# Patient Record
Sex: Male | Born: 1980 | Hispanic: Yes
Health system: Southern US, Community
[De-identification: ages and names within clinical notes are randomized; demographics above are authoritative.]

---

## 2020-05-08 ENCOUNTER — Emergency Department (HOSPITAL_COMMUNITY): Payer: Self-pay

## 2020-05-08 ENCOUNTER — Other Ambulatory Visit: Payer: Self-pay

## 2020-05-08 ENCOUNTER — Emergency Department (HOSPITAL_COMMUNITY)
Admission: EM | Admit: 2020-05-08 | Discharge: 2020-05-08 | Disposition: A | Discharge status: Home / Self Care | Payer: Self-pay | Attending: Emergency Medicine | Admitting: Emergency Medicine

## 2020-05-08 DIAGNOSIS — R569 Unspecified convulsions: Secondary | ICD-10-CM

## 2020-05-08 DIAGNOSIS — Y929 Unspecified place or not applicable: Secondary | ICD-10-CM | POA: Insufficient documentation

## 2020-05-08 DIAGNOSIS — Y939 Activity, unspecified: Secondary | ICD-10-CM | POA: Insufficient documentation

## 2020-05-08 DIAGNOSIS — R4182 Altered mental status, unspecified: Secondary | ICD-10-CM | POA: Insufficient documentation

## 2020-05-08 DIAGNOSIS — W19XXXA Unspecified fall, initial encounter: Secondary | ICD-10-CM | POA: Insufficient documentation

## 2020-05-08 DIAGNOSIS — R55 Syncope and collapse: Secondary | ICD-10-CM | POA: Insufficient documentation

## 2020-05-08 DIAGNOSIS — S0511XA Contusion of eyeball and orbital tissues, right eye, initial encounter: Secondary | ICD-10-CM | POA: Insufficient documentation

## 2020-05-08 DIAGNOSIS — Y999 Unspecified external cause status: Secondary | ICD-10-CM | POA: Insufficient documentation

## 2020-05-08 LAB — I-STAT VENOUS BLOOD GAS, ED
Acid-base deficit: 1 mmol/L (ref 0.0–2.0)
Bicarbonate: 24.2 mmol/L (ref 20.0–28.0)
Calcium, Ion: 1.11 mmol/L — ABNORMAL LOW (ref 1.15–1.40)
HCT: 40 % (ref 39.0–52.0)
Hemoglobin: 13.6 g/dL (ref 13.0–17.0)
O2 Saturation: 86 %
Potassium: 3.9 mmol/L (ref 3.5–5.1)
Sodium: 140 mmol/L (ref 135–145)
TCO2: 25 mmol/L (ref 22–32)
pCO2, Ven: 39.7 mmHg — ABNORMAL LOW (ref 44.0–60.0)
pH, Ven: 7.393 (ref 7.250–7.430)
pO2, Ven: 52 mmHg — ABNORMAL HIGH (ref 32.0–45.0)

## 2020-05-08 LAB — CBC WITH DIFFERENTIAL/PLATELET
Abs Immature Granulocytes: 0.13 10*3/uL — ABNORMAL HIGH (ref 0.00–0.07)
Basophils Absolute: 0.1 10*3/uL (ref 0.0–0.1)
Basophils Relative: 1 %
Eosinophils Absolute: 0.2 10*3/uL (ref 0.0–0.5)
Eosinophils Relative: 2 %
HCT: 48.4 % (ref 39.0–52.0)
Hemoglobin: 15.3 g/dL (ref 13.0–17.0)
Immature Granulocytes: 1 %
Lymphocytes Relative: 40 %
Lymphs Abs: 4.6 10*3/uL — ABNORMAL HIGH (ref 0.7–4.0)
MCH: 29.1 pg (ref 26.0–34.0)
MCHC: 31.6 g/dL (ref 30.0–36.0)
MCV: 92.2 fL (ref 80.0–100.0)
Monocytes Absolute: 0.8 10*3/uL (ref 0.1–1.0)
Monocytes Relative: 7 %
Neutro Abs: 5.8 10*3/uL (ref 1.7–7.7)
Neutrophils Relative %: 49 %
Platelets: 290 10*3/uL (ref 150–400)
RBC: 5.25 MIL/uL (ref 4.22–5.81)
RDW: 12 % (ref 11.5–15.5)
WBC: 11.7 10*3/uL — ABNORMAL HIGH (ref 4.0–10.5)
nRBC: 0 % (ref 0.0–0.2)

## 2020-05-08 LAB — BASIC METABOLIC PANEL
Anion gap: 9 (ref 5–15)
BUN: 8 mg/dL (ref 6–20)
CO2: 23 mmol/L (ref 22–32)
Calcium: 8.7 mg/dL — ABNORMAL LOW (ref 8.9–10.3)
Chloride: 107 mmol/L (ref 98–111)
Creatinine, Ser: 1.06 mg/dL (ref 0.61–1.24)
GFR calc Af Amer: >60 mL/min (ref >60)
GFR calc non Af Amer: >60 mL/min (ref >60)
Glucose, Bld: 104 mg/dL — ABNORMAL HIGH (ref 70–99)
Potassium: 3.9 mmol/L (ref 3.5–5.1)
Sodium: 139 mmol/L (ref 135–145)

## 2020-05-08 LAB — COMPREHENSIVE METABOLIC PANEL
ALT: 28 U/L (ref 0–44)
AST: 42 U/L — ABNORMAL HIGH (ref 15–41)
Albumin: 4.3 g/dL (ref 3.5–5.0)
Alkaline Phosphatase: 72 U/L (ref 38–126)
Anion gap: 24 — ABNORMAL HIGH (ref 5–15)
BUN: 10 mg/dL (ref 6–20)
CO2: 10 mmol/L — ABNORMAL LOW (ref 22–32)
Calcium: 9 mg/dL (ref 8.9–10.3)
Chloride: 108 mmol/L (ref 98–111)
Creatinine, Ser: 1.21 mg/dL (ref 0.61–1.24)
GFR calc Af Amer: >60 mL/min (ref >60)
GFR calc non Af Amer: >60 mL/min (ref >60)
Glucose, Bld: 140 mg/dL — ABNORMAL HIGH (ref 70–99)
Potassium: 3.7 mmol/L (ref 3.5–5.1)
Sodium: 142 mmol/L (ref 135–145)
Total Bilirubin: 0.8 mg/dL (ref 0.3–1.2)
Total Protein: 8 g/dL (ref 6.5–8.1)

## 2020-05-08 LAB — D-DIMER, QUANTITATIVE: D-Dimer, Quant: 0.32 ug/mL-FEU (ref 0.00–0.50)

## 2020-05-08 LAB — ETHANOL: Alcohol, Ethyl (B): <10 mg/dL (ref <10)

## 2020-05-08 LAB — CBG MONITORING, ED: Glucose-Capillary: 138 mg/dL — ABNORMAL HIGH (ref 70–99)

## 2020-05-08 LAB — MAGNESIUM: Magnesium: 2.1 mg/dL (ref 1.7–2.4)

## 2020-05-08 LAB — TROPONIN I (HIGH SENSITIVITY): Troponin I (High Sensitivity): 5 ng/L (ref <18)

## 2020-05-08 MED ORDER — LORAZEPAM 2 MG/ML IJ SOLN
INTRAMUSCULAR | Status: AC
  Administered 2020-05-08: 2 mg
  Filled 2020-05-08: qty 1

## 2020-05-08 MED ORDER — SODIUM CHLORIDE 0.9 % IV BOLUS
1000.0000 mL | Freq: Once | INTRAVENOUS | Status: AC
  Administered 2020-05-08: 1000 mL via INTRAVENOUS

## 2020-05-08 MED ORDER — LEVETIRACETAM 500 MG PO TABS: 500.0000 mg | ORAL_TABLET | Freq: Two times a day (BID) | ORAL | 0 refills | Status: AC

## 2020-05-08 MED ORDER — SODIUM CHLORIDE 0.9 % IV SOLN
2000.0000 mg | Freq: Once | INTRAVENOUS | Status: AC
  Administered 2020-05-08: 2000 mg via INTRAVENOUS
  Filled 2020-05-08: qty 20

## 2020-05-08 NOTE — ED Notes (Signed)
Patient ambulated with stand by assist. Steady gait. EDP aware

## 2020-05-08 NOTE — Discharge Instructions (Addendum)
Please return for any problem.  Follow-up with your regular care provider as instructed.  Follow-up with neurology as instructed.

## 2020-05-08 NOTE — ED Provider Notes (Signed)
[facility] EMERGENCY DEPARTMENT Provider Note   CSN: [id number] Arrival date & time: 05/08/20  1115     History Chief Complaint  Patient presents with  . Fall  . Loss of Consciousness    Matthew Gallagher is a 39 y.o. male.  The history is provided by the EMS personnel. A language interpreter was used.  Fall  Loss of Consciousness  Matthew Gallagher is a 39 y.o. male who presents to the Emergency Department complaining of syncope.  Pt presents to the ED for evaluation following fall/syncopal event.  Level V caveat due to AMS.  Per EMS pt was walking, had been drinking last night and he passed out, striking his head.  Per EMS he was awake and alert on their arrival.  On transfer from EMS stretcher to ED stretcher pt with seizure activity.     No past medical history on file.  There are no problems to display for this patient.        No family history on file.  Social History   Tobacco Use  . Smoking status: Not on file  Substance Use Topics  . Alcohol use: Not on file  . Drug use: Not on file    Home Medications Prior to Admission medications   Medication Sig Start Date End Date Taking? Authorizing Provider  levETIRAcetam (KEPPRA) 500 MG tablet Take 1 tablet (500 mg total) by mouth 2 (two) times daily. 05/08/20   Tilden Fossa, MD    Allergies    Patient has no allergy information on record.  Review of Systems   Review of Systems  Unable to perform ROS: Mental status change  Cardiovascular: Positive for syncope.    Physical Exam Updated Vital Signs BP 115/69   Pulse 82   Resp 16   SpO2 97%   Physical Exam Vitals and nursing note reviewed.  Constitutional:      Appearance: He is well-developed.  HENT:     Head: Normocephalic.     Comments: Swelling to right forehead.  Right subconjunctival hemmorrhage. PERRL Cardiovascular:     Rate and Rhythm: Regular rhythm. Tachycardia present.     Heart sounds: No murmur  heard.   Pulmonary:     Effort: Pulmonary effort is normal. No respiratory distress.     Breath sounds: Normal breath sounds.  Abdominal:     Palpations: Abdomen is soft.     Tenderness: There is no abdominal tenderness. There is no guarding or rebound.  Musculoskeletal:        General: No tenderness.  Skin:    General: Skin is warm and dry.  Neurological:     Mental Status: He is alert.     Comments: Nonverbal.  Localizes to pain. MAE symmetrically.    Psychiatric:     Comments: Unable to assess     ED Results / Procedures / Treatments   Labs (all labs ordered are listed, but only abnormal results are displayed) Labs Reviewed  CBC WITH DIFFERENTIAL/PLATELET - Abnormal; Notable for the following components:      Result Value   WBC 11.7 (*)    Lymphs Abs 4.6 (*)    Abs Immature Granulocytes 0.13 (*)    All other components within normal limits  COMPREHENSIVE METABOLIC PANEL - Abnormal; Notable for the following components:   CO2 10 (*)    Glucose, Bld 140 (*)    AST 42 (*)    Anion gap 24 (*)    All other components  within normal limits  CBG MONITORING, ED - Abnormal; Notable for the following components:   Glucose-Capillary 138 (*)    All other components within normal limits  ETHANOL  MAGNESIUM  URINALYSIS, ROUTINE W REFLEX MICROSCOPIC  RAPID URINE DRUG SCREEN, HOSP PERFORMED  D-DIMER, QUANTITATIVE (NOT AT [facility])  BASIC METABOLIC PANEL  I-STAT VENOUS BLOOD GAS, ED  TROPONIN I (HIGH SENSITIVITY)    EKG EKG Interpretation  Date/Time:  Saturday May 08 2020 11:22:33 EDT Ventricular Rate:  116 PR Interval:    QRS Duration: 96 QT Interval:  332 QTC Calculation: 462 R Axis:   108 Text Interpretation: Sinus tachycardia Ventricular premature complex Aberrant complex Consider right atrial enlargement Right axis deviation RSR' in V1 or V2, probably normal variant Confirmed by Tilden Fossa [phone]) on 05/08/2020 11:31:47 AM   Radiology CT Head Wo  Contrast  Result Date: 05/08/2020 CLINICAL DATA:  Larey Seat and struck head, loss of consciousness, drinking last night, seizure EXAM: CT HEAD WITHOUT CONTRAST CT CERVICAL SPINE WITHOUT CONTRAST TECHNIQUE: Multidetector CT imaging of the head and cervical spine was performed following the standard protocol without intravenous contrast. Multiplanar CT image reconstructions of the cervical spine were also generated. COMPARISON:  None FINDINGS: CT HEAD FINDINGS Brain: Normal ventricular morphology. No midline shift or mass effect. Normal appearance of brain parenchyma. No intracranial hemorrhage, mass lesion, evidence of acute infarction, or extra-axial fluid collection. Vascular: No hyperdense vessels Skull: Calvaria intact.  Small RIGHT frontal scalp hematoma. Sinuses/Orbits: Clear Other: N/A CT CERVICAL SPINE FINDINGS Alignment: Normal Skull base and vertebrae: Osseous mineralization normal. Skull base intact. Vertebral body and disc space heights maintained. No fracture, subluxation, or bone destruction. Soft tissues and spinal canal: Prevertebral soft tissues normal thickness. Visualized cervical soft tissues unremarkable. Disc levels:  Unremarkable Upper chest: Lung apices clear Other: N/A IMPRESSION: Normal CT head. Normal CT cervical spine. Electronically Signed   By: Ulyses Southward M.D.   On: 05/08/2020 12:34   CT Cervical Spine Wo Contrast  Result Date: 05/08/2020 CLINICAL DATA:  Larey Seat and struck head, loss of consciousness, drinking last night, seizure EXAM: CT HEAD WITHOUT CONTRAST CT CERVICAL SPINE WITHOUT CONTRAST TECHNIQUE: Multidetector CT imaging of the head and cervical spine was performed following the standard protocol without intravenous contrast. Multiplanar CT image reconstructions of the cervical spine were also generated. COMPARISON:  None FINDINGS: CT HEAD FINDINGS Brain: Normal ventricular morphology. No midline shift or mass effect. Normal appearance of brain parenchyma. No intracranial  hemorrhage, mass lesion, evidence of acute infarction, or extra-axial fluid collection. Vascular: No hyperdense vessels Skull: Calvaria intact.  Small RIGHT frontal scalp hematoma. Sinuses/Orbits: Clear Other: N/A CT CERVICAL SPINE FINDINGS Alignment: Normal Skull base and vertebrae: Osseous mineralization normal. Skull base intact. Vertebral body and disc space heights maintained. No fracture, subluxation, or bone destruction. Soft tissues and spinal canal: Prevertebral soft tissues normal thickness. Visualized cervical soft tissues unremarkable. Disc levels:  Unremarkable Upper chest: Lung apices clear Other: N/A IMPRESSION: Normal CT head. Normal CT cervical spine. Electronically Signed   By: Ulyses Southward M.D.   On: 05/08/2020 12:34   DG Chest Port 1 View  Result Date: 05/08/2020 CLINICAL DATA:  Syncope, altered mental status EXAM: PORTABLE CHEST 1 VIEW COMPARISON:  Portable exam 1204 hours without priors for comparison FINDINGS: Normal heart size, mediastinal contours, and pulmonary vascularity. Low lung volumes with minimal RIGHT basilar atelectasis. Lungs otherwise clear. No pulmonary infiltrate, pleural effusion or pneumothorax. IMPRESSION: Minimal RIGHT basilar atelectasis. Electronically Signed   By: Loraine Leriche  Tyron Russell M.D.   On: 05/08/2020 12:46    Procedures Procedures (including critical care time) CRITICAL CARE Performed by: Tilden Fossa   Total critical care time: 35 minutes  Critical care time was exclusive of separately billable procedures and treating other patients.  Critical care was necessary to treat or prevent imminent or life-threatening deterioration.  Critical care was time spent personally by me on the following activities: development of treatment plan with patient and/or surrogate as well as nursing, discussions with consultants, evaluation of patient's response to treatment, examination of patient, obtaining history from patient or surrogate, ordering and performing  treatments and interventions, ordering and review of laboratory studies, ordering and review of radiographic studies, pulse oximetry and re-evaluation of patient's condition.  Medications Ordered in ED Medications  LORazepam (ATIVAN) 2 MG/ML injection (2 mg  Given 05/08/20 1129)  levETIRAcetam (KEPPRA) 2,000 mg in sodium chloride 0.9 % 250 mL IVPB (0 mg Intravenous Stopped 05/08/20 1346)  sodium chloride 0.9 % bolus 1,000 mL (1,000 mLs Intravenous Bolus from Bag 05/08/20 1353)    ED Course  I have reviewed the triage vital signs and the nursing notes.  Pertinent labs & imaging results that were available during my care of the patient were reviewed by me and considered in my medical decision making (see chart for details).    MDM Rules/Calculators/A&P                         Patient here for evaluation following a syncopal event. He did have a seizure on ED arrival. On repeat assessment after patient's initial arrival he reports that he was feeling dizzy and fell and struck his head. He does not recall the seizure event that occurred on ED arrival. He does have a history of seizure in January of this year and was on medications but they were discontinued a few months ago. He does not smoke tobacco. He drinks occasional alcohol. He did drink alcohol last night. No street drug use. He does not drive. He was treated with Ativan, Keppra load for seizure. Given his history of seizure case was discussed with neurologist on-call, will start on Keppra 500 mg BID. Labs with decreased bicarb, elevated anion gap. This is likely due to seizure given his syncopal event will recheck BMP and check of VBG. Due to syncopal event will check a D dimer. Patient care transferred pending reassessment and repeat labs.  Final Clinical Impression(s) / ED Diagnoses Final diagnoses:  Syncope, unspecified syncope type  Contusion of right orbital tissues, initial encounter  Seizure [facility])    Rx / DC Orders ED Discharge  Orders         Ordered    levETIRAcetam (KEPPRA) 500 MG tablet  2 times daily        05/08/20 1510    Ambulatory referral to Neurology       Comments: An appointment is requested in approximately: 4 weeks   05/08/20 1511           Tilden Fossa, MD 05/08/20 [phone]

## 2020-05-08 NOTE — ED Triage Notes (Signed)
Pt arrived to ED via [facility] d/t pt falling and hitting his head during LOC, pt had been drinking last night. EMS reports pt was A/Ox4 upon their arrival, minimal comprehension of english. While transporting to his room pt began having a seizure, convulsions on the EMS stretcher. Pt is noted to have redness in his Right eye and a bump on his top Right forehead from where he fell and hit the ground.

## 2022-08-27 IMAGING — CT CT HEAD W/O CM
4 series · 16 of 47 positions shown, 18 images · non-contrast
Comparison: None

CLINICAL DATA: Fell and struck head, loss of consciousness,
drinking last night, seizure

EXAM:
CT HEAD WITHOUT CONTRAST
CT CERVICAL SPINE WITHOUT CONTRAST
TECHNIQUE: Multidetector CT imaging of the head and cervical spine was
performed following the standard protocol without intravenous
contrast. Multiplanar CT image reconstructions of the cervical spine
were also generated.

[Series 3: head without · axial · non-contrast · 0.38mm/px · z∈[-152,-32]mm · 7 of 33 slices shown, 9 images]
[im 5/33  brain]
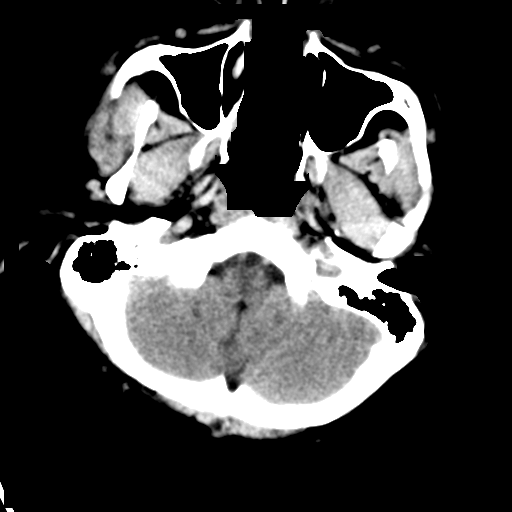
[im 5/33  bone]
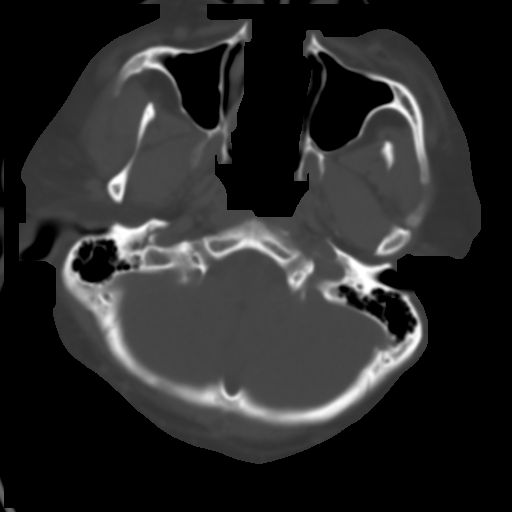
[im 9/33  brain]
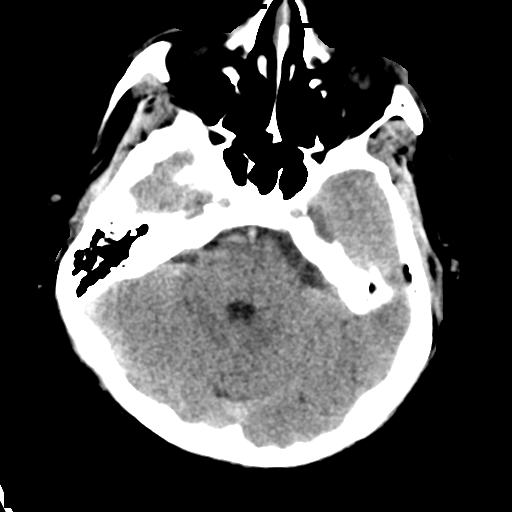
[im 13/33  brain]
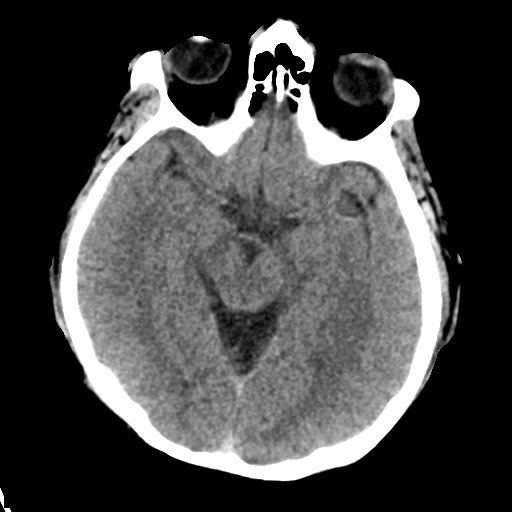
[im 17/33  brain]
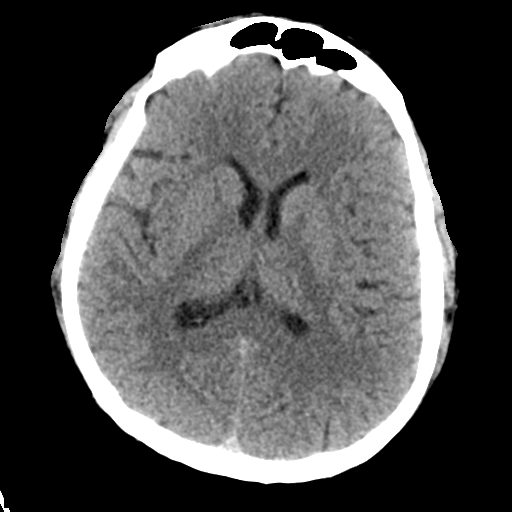
[im 21/33  brain]
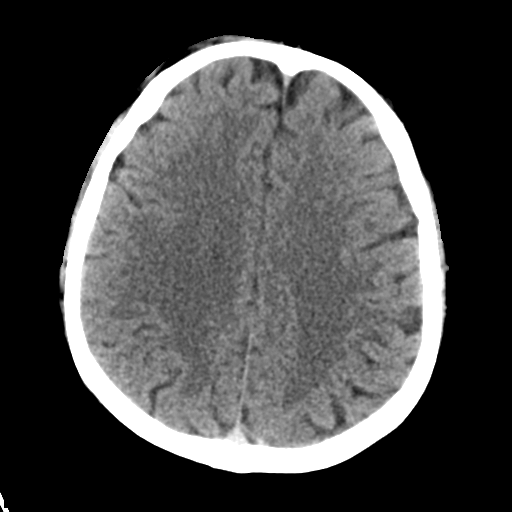
[im 21/33  bone]
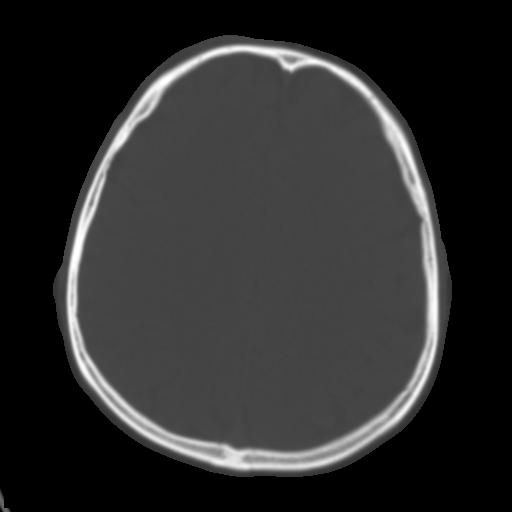
[im 25/33  brain]
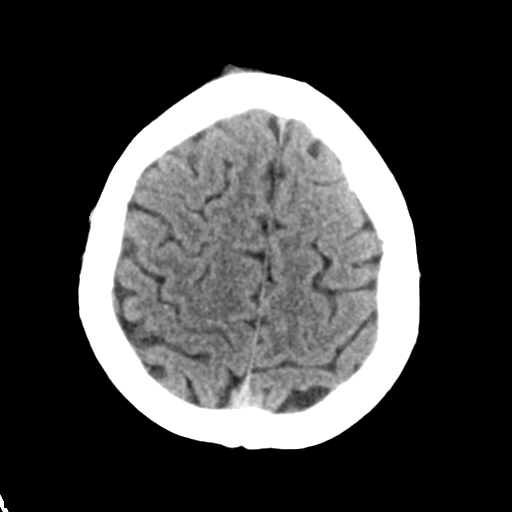
[im 29/33  brain]
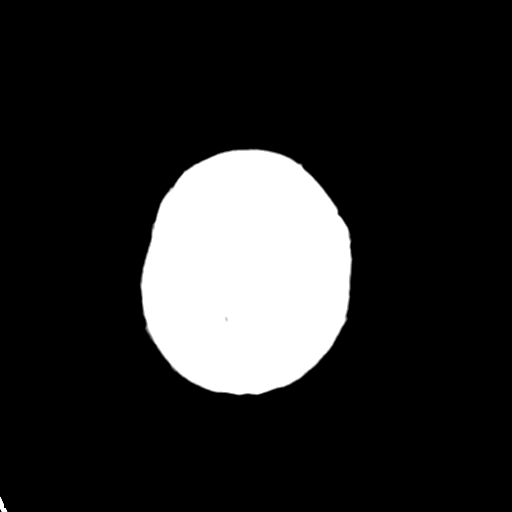

[Series 4: head bone · axial · 0.38mm/px · z∈[-156,-124]mm · 3 of 82 slices shown]
[im 9/82  bone]
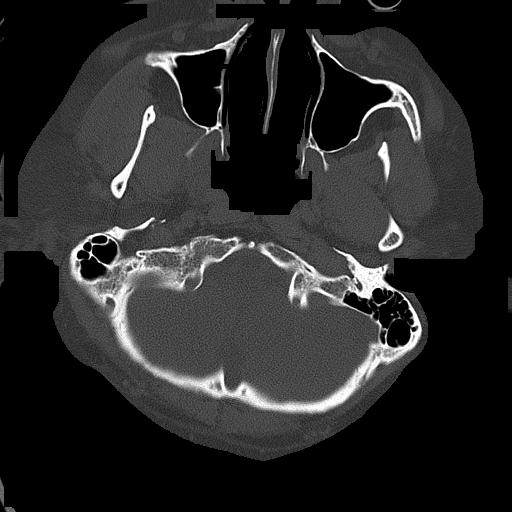
[im 17/82  bone]
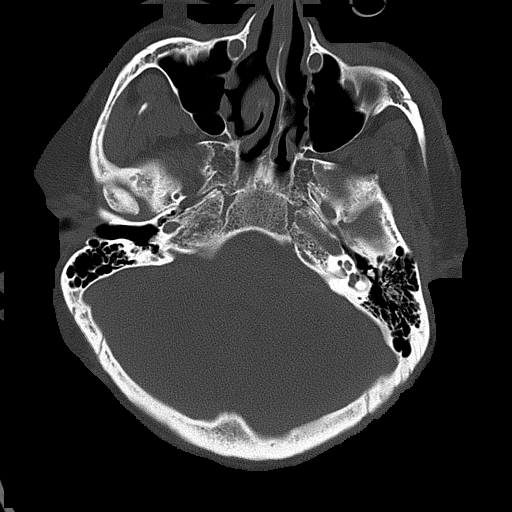
[im 25/82  bone]
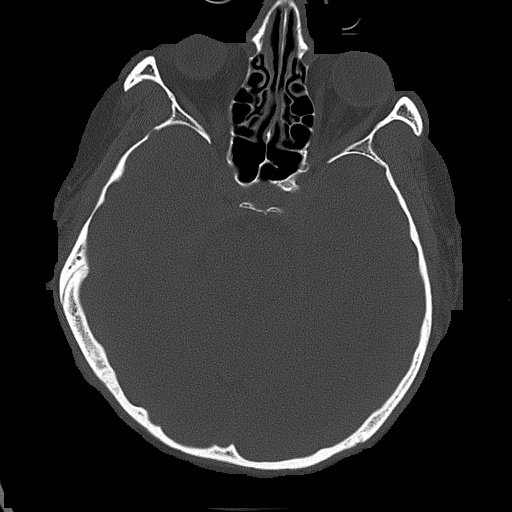

[Series 5: head without cor · coronal · non-contrast · 0.32mm/px · 3 of 67 slices shown]
[im 23/67  brain]
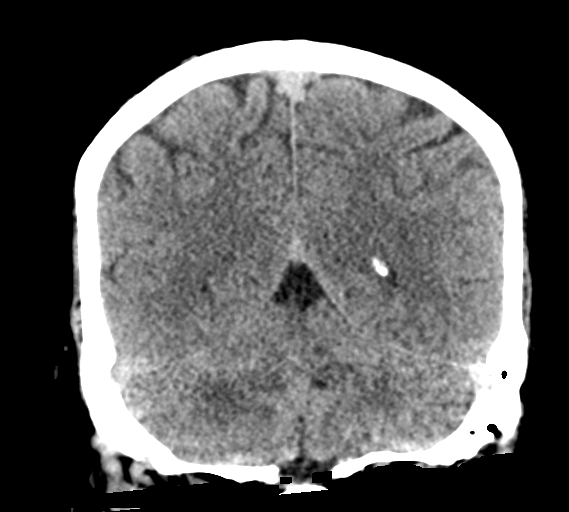
[im 30/67  brain]
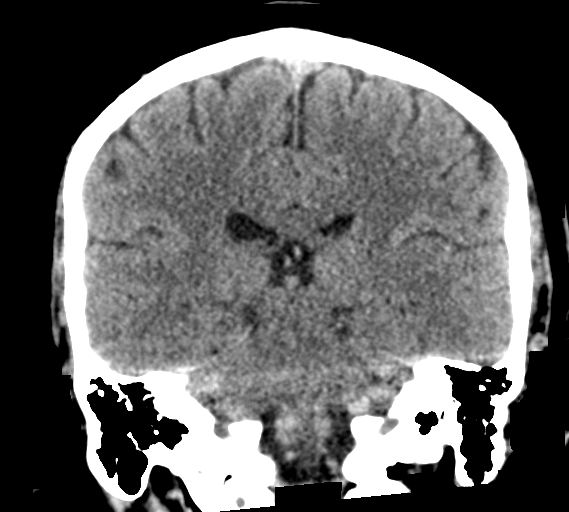
[im 37/67  brain]
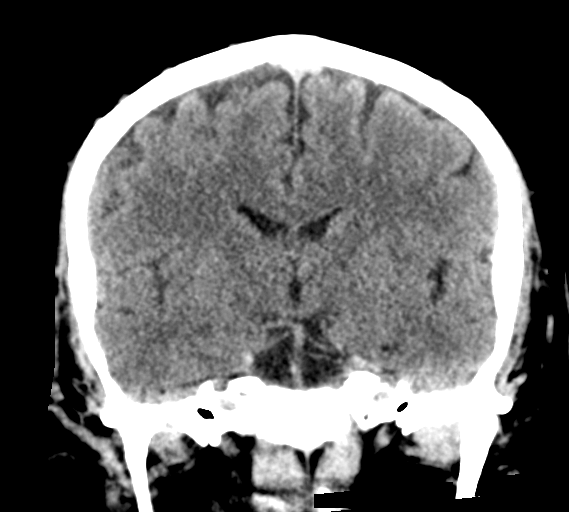

[Series 6: head without sag · sagittal · non-contrast · 0.32mm/px · 3 of 61 slices shown]
[im 21/61  brain]
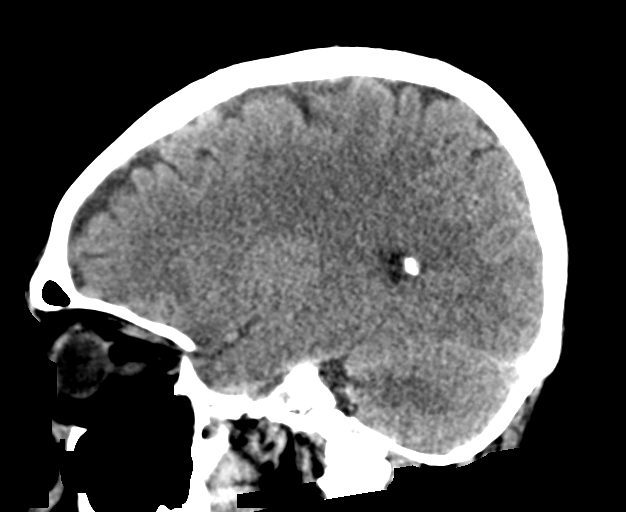
[im 31/61  brain]
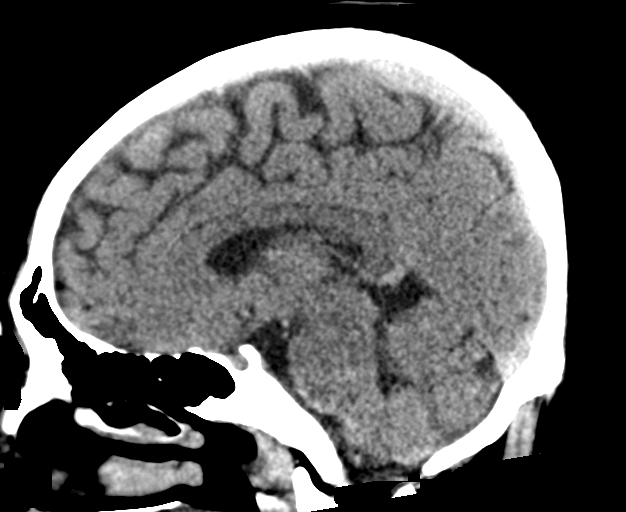
[im 41/61  brain]
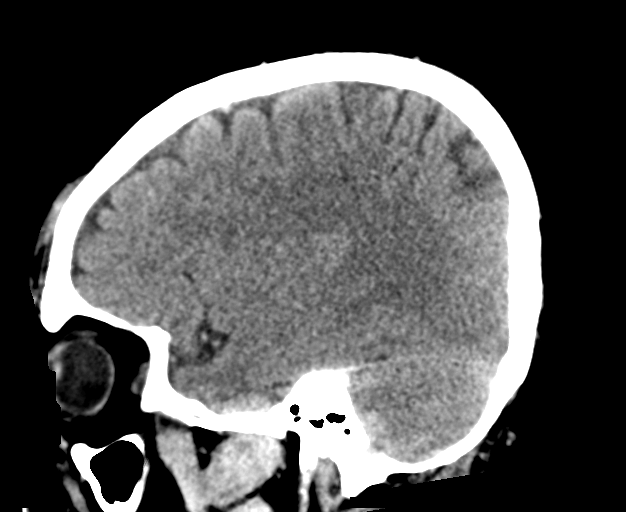

[16 of 47 positions shown; findings below may reference images not displayed]

FINDINGS: CT HEAD FINDINGS

Brain: Normal ventricular morphology. No midline shift or mass
effect. Normal appearance of brain parenchyma. No intracranial
hemorrhage, mass lesion, evidence of acute infarction, or
extra-axial fluid collection.

Vascular: No hyperdense vessels

Skull: Calvaria intact.  Small RIGHT frontal scalp hematoma.

Sinuses/Orbits: Clear

Other: N/A

CT CERVICAL SPINE FINDINGS

Alignment: Normal

Skull base and vertebrae: Osseous mineralization normal. Skull base
intact. Vertebral body and disc space heights maintained. No
fracture, subluxation, or bone destruction.

Soft tissues and spinal canal: Prevertebral soft tissues normal
thickness. Visualized cervical soft tissues unremarkable.

Disc levels:  Unremarkable

Upper chest: Lung apices clear

Other: N/A
IMPRESSION: Normal CT head.

Normal CT cervical spine.
